# Patient Record
Sex: Male | Born: 1970 | Race: White | Hispanic: No | Marital: Married | Smoking: Never smoker
Health system: Southern US, Community
[De-identification: ages and names within clinical notes are randomized; demographics above are authoritative.]

---

## 2013-01-17 ENCOUNTER — Encounter: Payer: Self-pay | Admitting: Physician Assistant

## 2013-05-16 NOTE — Progress Notes (Signed)
This encounter was created in error - please disregard.

## 2014-10-29 DIAGNOSIS — M47816 Spondylosis without myelopathy or radiculopathy, lumbar region: Secondary | ICD-10-CM | POA: Insufficient documentation

## 2014-10-29 DIAGNOSIS — M5136 Other intervertebral disc degeneration, lumbar region: Secondary | ICD-10-CM | POA: Insufficient documentation

## 2014-10-29 DIAGNOSIS — M48061 Spinal stenosis, lumbar region without neurogenic claudication: Secondary | ICD-10-CM | POA: Insufficient documentation

## 2014-10-29 DIAGNOSIS — M5416 Radiculopathy, lumbar region: Secondary | ICD-10-CM | POA: Insufficient documentation

## 2014-11-14 ENCOUNTER — Other Ambulatory Visit: Payer: Self-pay | Admitting: Neurosurgery

## 2014-11-14 ENCOUNTER — Other Ambulatory Visit (HOSPITAL_COMMUNITY): Payer: Self-pay | Admitting: Neurosurgery

## 2014-11-14 DIAGNOSIS — M5416 Radiculopathy, lumbar region: Secondary | ICD-10-CM

## 2014-11-27 ENCOUNTER — Ambulatory Visit
Admission: RE | Admit: 2014-11-27 | Discharge: 2014-11-27 | Disposition: A | Discharge status: Home / Self Care | Payer: No Typology Code available for payment source | Source: Ambulatory Visit | Attending: Neurosurgery | Admitting: Neurosurgery

## 2014-11-27 DIAGNOSIS — M5416 Radiculopathy, lumbar region: Secondary | ICD-10-CM

## 2014-11-27 MED ORDER — METHYLPREDNISOLONE ACETATE 40 MG/ML INJ SUSP (RADIOLOG
120.0000 mg | Freq: Once | INTRAMUSCULAR | Status: AC
  Administered 2014-11-27: 120 mg via EPIDURAL

## 2014-11-27 NOTE — Discharge Instructions (Signed)

## 2015-01-01 ENCOUNTER — Other Ambulatory Visit: Payer: Self-pay | Admitting: Neurosurgery

## 2015-01-01 DIAGNOSIS — M9983 Other biomechanical lesions of lumbar region: Secondary | ICD-10-CM

## 2015-01-01 DIAGNOSIS — M5416 Radiculopathy, lumbar region: Secondary | ICD-10-CM

## 2015-01-03 ENCOUNTER — Ambulatory Visit
Admission: RE | Admit: 2015-01-03 | Discharge: 2015-01-03 | Disposition: A | Discharge status: Home / Self Care | Payer: No Typology Code available for payment source | Source: Ambulatory Visit | Attending: Neurosurgery | Admitting: Neurosurgery

## 2015-01-03 DIAGNOSIS — M5416 Radiculopathy, lumbar region: Secondary | ICD-10-CM

## 2015-01-03 DIAGNOSIS — M9983 Other biomechanical lesions of lumbar region: Secondary | ICD-10-CM

## 2015-01-03 MED ORDER — METHYLPREDNISOLONE ACETATE 40 MG/ML INJ SUSP (RADIOLOG
120.0000 mg | Freq: Once | INTRAMUSCULAR | Status: AC
  Administered 2015-01-03: 120 mg via EPIDURAL

## 2015-02-10 ENCOUNTER — Other Ambulatory Visit: Payer: Self-pay | Admitting: Neurosurgery

## 2015-02-10 DIAGNOSIS — M48061 Spinal stenosis, lumbar region without neurogenic claudication: Secondary | ICD-10-CM

## 2015-02-12 ENCOUNTER — Other Ambulatory Visit: Payer: No Typology Code available for payment source

## 2015-02-14 ENCOUNTER — Ambulatory Visit
Admission: RE | Admit: 2015-02-14 | Discharge: 2015-02-14 | Disposition: A | Discharge status: Home / Self Care | Payer: No Typology Code available for payment source | Source: Ambulatory Visit | Attending: Neurosurgery | Admitting: Neurosurgery

## 2015-02-14 DIAGNOSIS — M48061 Spinal stenosis, lumbar region without neurogenic claudication: Secondary | ICD-10-CM

## 2015-02-14 MED ORDER — METHYLPREDNISOLONE ACETATE 40 MG/ML INJ SUSP (RADIOLOG
120.0000 mg | Freq: Once | INTRAMUSCULAR | Status: AC
  Administered 2015-02-14: 120 mg via EPIDURAL

## 2015-03-18 DIAGNOSIS — R1030 Lower abdominal pain, unspecified: Secondary | ICD-10-CM | POA: Insufficient documentation

## 2016-01-13 DIAGNOSIS — E781 Pure hyperglyceridemia: Secondary | ICD-10-CM | POA: Insufficient documentation

## 2016-01-13 DIAGNOSIS — E669 Obesity, unspecified: Secondary | ICD-10-CM | POA: Insufficient documentation

## 2016-01-13 DIAGNOSIS — M5440 Lumbago with sciatica, unspecified side: Secondary | ICD-10-CM | POA: Insufficient documentation

## 2016-01-13 DIAGNOSIS — K219 Gastro-esophageal reflux disease without esophagitis: Secondary | ICD-10-CM | POA: Insufficient documentation

## 2016-01-13 DIAGNOSIS — M5126 Other intervertebral disc displacement, lumbar region: Secondary | ICD-10-CM | POA: Insufficient documentation

## 2016-01-13 DIAGNOSIS — M5136 Other intervertebral disc degeneration, lumbar region: Secondary | ICD-10-CM | POA: Insufficient documentation

## 2016-01-13 DIAGNOSIS — H9190 Unspecified hearing loss, unspecified ear: Secondary | ICD-10-CM | POA: Insufficient documentation

## 2016-01-13 DIAGNOSIS — M51369 Other intervertebral disc degeneration, lumbar region without mention of lumbar back pain or lower extremity pain: Secondary | ICD-10-CM | POA: Insufficient documentation

## 2016-02-24 ENCOUNTER — Other Ambulatory Visit: Payer: Self-pay | Admitting: Physical Medicine and Rehabilitation

## 2016-02-24 DIAGNOSIS — M5416 Radiculopathy, lumbar region: Secondary | ICD-10-CM

## 2016-03-08 ENCOUNTER — Ambulatory Visit
Admission: RE | Admit: 2016-03-08 | Discharge: 2016-03-08 | Disposition: A | Discharge status: Home / Self Care | Payer: BLUE CROSS/BLUE SHIELD | Source: Ambulatory Visit | Attending: Physical Medicine and Rehabilitation | Admitting: Physical Medicine and Rehabilitation

## 2016-03-08 DIAGNOSIS — M5416 Radiculopathy, lumbar region: Secondary | ICD-10-CM

## 2016-03-08 MED ORDER — METHYLPREDNISOLONE ACETATE 40 MG/ML INJ SUSP (RADIOLOG
120.0000 mg | Freq: Once | INTRAMUSCULAR | Status: AC
  Administered 2016-03-08: 120 mg via EPIDURAL

## 2016-03-08 NOTE — Discharge Instructions (Signed)

## 2016-04-27 DIAGNOSIS — M5412 Radiculopathy, cervical region: Secondary | ICD-10-CM | POA: Insufficient documentation

## 2016-04-27 DIAGNOSIS — G8929 Other chronic pain: Secondary | ICD-10-CM | POA: Insufficient documentation

## 2016-04-27 DIAGNOSIS — M542 Cervicalgia: Secondary | ICD-10-CM

## 2016-04-27 DIAGNOSIS — Z9889 Other specified postprocedural states: Secondary | ICD-10-CM | POA: Insufficient documentation

## 2016-04-29 ENCOUNTER — Other Ambulatory Visit: Payer: Self-pay | Admitting: Physical Medicine and Rehabilitation

## 2016-04-29 DIAGNOSIS — M5416 Radiculopathy, lumbar region: Secondary | ICD-10-CM

## 2016-05-07 ENCOUNTER — Other Ambulatory Visit: Payer: Self-pay | Admitting: Physical Medicine and Rehabilitation

## 2016-05-07 ENCOUNTER — Ambulatory Visit
Admission: RE | Admit: 2016-05-07 | Discharge: 2016-05-07 | Disposition: A | Discharge status: Home / Self Care | Payer: BLUE CROSS/BLUE SHIELD | Source: Ambulatory Visit | Attending: Physical Medicine and Rehabilitation | Admitting: Physical Medicine and Rehabilitation

## 2016-05-07 DIAGNOSIS — M5416 Radiculopathy, lumbar region: Secondary | ICD-10-CM

## 2016-05-07 MED ORDER — METHYLPREDNISOLONE ACETATE 40 MG/ML INJ SUSP (RADIOLOG
120.0000 mg | Freq: Once | INTRAMUSCULAR | Status: AC
  Administered 2016-05-07: 120 mg via EPIDURAL

## 2016-05-07 NOTE — Discharge Instructions (Signed)
